# Patient Record
Sex: Male | Born: 1980 | Race: Black or African American | Hispanic: No | Marital: Single | Smoking: Former smoker
Health system: Southern US, Community
[De-identification: ages and names within clinical notes are randomized; demographics above are authoritative.]

## PROBLEM LIST (undated history)

## (undated) DIAGNOSIS — J45909 Unspecified asthma, uncomplicated: Secondary | ICD-10-CM

## (undated) HISTORY — DX: Unspecified asthma, uncomplicated: J45.909

---
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
## 2004-02-05 ENCOUNTER — Emergency Department (HOSPITAL_COMMUNITY): Admission: EM | Admit: 2004-02-05 | Discharge: 2004-02-05 | Payer: Self-pay | Admitting: Emergency Medicine

## 2006-06-10 ENCOUNTER — Encounter: Admission: RE | Admit: 2006-06-10 | Discharge: 2006-06-10 | Payer: Self-pay | Admitting: Occupational Medicine

## 2007-10-09 ENCOUNTER — Emergency Department (HOSPITAL_COMMUNITY): Admission: EM | Admit: 2007-10-09 | Discharge: 2007-10-09 | Payer: Self-pay | Admitting: Family Medicine

## 2009-01-24 ENCOUNTER — Emergency Department (HOSPITAL_COMMUNITY): Admission: EM | Admit: 2009-01-24 | Discharge: 2009-01-24 | Payer: Self-pay | Admitting: Emergency Medicine

## 2009-09-19 ENCOUNTER — Emergency Department (HOSPITAL_COMMUNITY): Admission: EM | Admit: 2009-09-19 | Discharge: 2009-09-19 | Payer: Self-pay | Admitting: Emergency Medicine

## 2010-06-29 LAB — URINALYSIS, ROUTINE W REFLEX MICROSCOPIC
Bilirubin Urine: NEGATIVE
Glucose, UA: NEGATIVE mg/dL
Hgb urine dipstick: NEGATIVE
Ketones, ur: NEGATIVE mg/dL
Nitrite: NEGATIVE
Protein, ur: NEGATIVE mg/dL
Specific Gravity, Urine: 1.005 (ref 1.005–1.030)
Urobilinogen, UA: 0.2 mg/dL (ref 0.0–1.0)
pH: 7.5 (ref 5.0–8.0)

## 2010-06-29 LAB — CBC
MCHC: 33 g/dL (ref 30.0–36.0)
MCV: 88.5 fL (ref 78.0–100.0)
Platelets: 135 10*3/uL — ABNORMAL LOW (ref 150–400)

## 2010-06-29 LAB — DIFFERENTIAL
Basophils Absolute: 0 10*3/uL (ref 0.0–0.1)
Basophils Relative: 0 % (ref 0–1)
Eosinophils Absolute: 0.1 10*3/uL (ref 0.0–0.7)
Neutro Abs: 2.7 10*3/uL (ref 1.7–7.7)
Neutrophils Relative %: 51 % (ref 43–77)

## 2010-06-29 LAB — CK TOTAL AND CKMB (NOT AT ARMC)
CK, MB: 3.2 ng/mL (ref 0.3–4.0)
Relative Index: 0.5 (ref 0.0–2.5)
Total CK: 621 U/L — ABNORMAL HIGH (ref 7–232)

## 2010-06-29 LAB — POCT I-STAT, CHEM 8
Chloride: 104 mEq/L (ref 96–112)
Glucose, Bld: 93 mg/dL (ref 70–99)
HCT: 44 % (ref 39.0–52.0)
Hemoglobin: 15 g/dL (ref 13.0–17.0)
Potassium: 3.5 mEq/L (ref 3.5–5.1)
Sodium: 140 mEq/L (ref 135–145)

## 2010-06-29 LAB — RAPID URINE DRUG SCREEN, HOSP PERFORMED
Amphetamines: NOT DETECTED
Barbiturates: NOT DETECTED
Benzodiazepines: NOT DETECTED
Cocaine: NOT DETECTED
Opiates: NOT DETECTED
Tetrahydrocannabinol: NOT DETECTED

## 2010-06-29 LAB — TROPONIN I: Troponin I: <0.01 ng/mL (ref 0.00–0.06)

## 2013-08-07 ENCOUNTER — Ambulatory Visit: Payer: PRIVATE HEALTH INSURANCE | Admitting: Physician Assistant

## 2013-08-07 VITALS — BP 120/72 | HR 65 | Temp 98.6°F | Resp 16 | Ht 68.0 in | Wt 152.6 lb

## 2013-08-07 DIAGNOSIS — J309 Allergic rhinitis, unspecified: Secondary | ICD-10-CM

## 2013-08-07 DIAGNOSIS — J45901 Unspecified asthma with (acute) exacerbation: Secondary | ICD-10-CM

## 2013-08-07 DIAGNOSIS — R062 Wheezing: Secondary | ICD-10-CM

## 2013-08-07 MED ORDER — MONTELUKAST SODIUM 10 MG PO TABS: 10.0000 mg | ORAL_TABLET | Freq: Every day | ORAL | Status: DC

## 2013-08-07 MED ORDER — ALBUTEROL SULFATE (2.5 MG/3ML) 0.083% IN NEBU: 2.5000 mg | INHALATION_SOLUTION | Freq: Once | RESPIRATORY_TRACT | Status: DC

## 2013-08-07 MED ORDER — PREDNISONE 20 MG PO TABS: ORAL_TABLET | ORAL | Status: DC

## 2013-08-07 MED ORDER — ALBUTEROL SULFATE HFA 108 (90 BASE) MCG/ACT IN AERS: 2.0000 | INHALATION_SPRAY | RESPIRATORY_TRACT | Status: DC | PRN

## 2013-08-07 MED ORDER — IPRATROPIUM BROMIDE 0.02 % IN SOLN: 0.5000 mg | Freq: Once | RESPIRATORY_TRACT | Status: DC

## 2013-08-07 NOTE — Progress Notes (Signed)
   Subjective:    Patient ID: Brian Kline, male    DOB: Aug 21, 1980, 33 y.o.   MRN: [id number]  HPI 33 year old male presents for evaluation 2 day history of SOB, chest tightness, wheezing, and cough.  Symptoms started suddenly as a result of seasonal allergies.  Does normally have problems in the spring/fall and takes Zyrtec which helps somewhat. Uses albuterol prn, usually 1-2 times daily.  Has tried nasal sprays in the past which have not provided any relief.  Has now run out of his inhaler so presents today for refills. Also complains of chest pain while coughing and taking deep breaths. Pain is exacerbated by movement - he does do a lot of heavy lifting at work.   Denies fever, chills, nausea, vomiting, headache, dizziness, otalgia, sore throat, or sinus pain.  He is otherwise doing well with no other concerns today.  Current every day smoker, about 1 ppd. Has failed many attempts to quit including Chantix.  Continues to work on smoking cessation.     Review of Systems  Constitutional: Negative for fever and chills.  HENT: Positive for rhinorrhea. Negative for congestion, postnasal drip, sinus pressure and sore throat.   Respiratory: Positive for cough, chest tightness, shortness of breath and wheezing.   Cardiovascular: Positive for chest pain.  Gastrointestinal: Negative for nausea and vomiting.  Neurological: Negative for dizziness and headaches.       Objective:   Physical Exam  Constitutional: He is oriented to person, place, and time. He appears well-developed and well-nourished.  HENT:  Head: Normocephalic and atraumatic.  Right Ear: External ear normal.  Left Ear: External ear normal.  Eyes: Conjunctivae are normal.  Neck: Normal range of motion. Neck supple.  Cardiovascular: Normal rate, regular rhythm and normal heart sounds.   Pulmonary/Chest: Effort normal. He has wheezes.  Neurological: He is alert and oriented to person, place, and time.  Psychiatric: He has a  normal mood and affect. His behavior is normal. Judgment and thought content normal.      Wheezing and SOB improved s/p albuterol/atrovent nebulizer.     Assessment & Plan:  Asthma with acute exacerbation - Plan: albuterol (PROVENTIL) (2.5 MG/3ML) 0.083% nebulizer solution 2.5 mg, ipratropium (ATROVENT) nebulizer solution 0.5 mg, predniSONE (DELTASONE) 20 MG tablet, albuterol (PROVENTIL HFA;VENTOLIN HFA) 108 (90 BASE) MCG/ACT inhaler,   Wheezing - Plan: albuterol (PROVENTIL) (2.5 MG/3ML) 0.083% nebulizer solution 2.5 mg, ipratropium (ATROVENT) nebulizer solution 0.5 mg, predniSONE (DELTASONE) 20 MG tablet, albuterol (PROVENTIL HFA;VENTOLIN HFA) 108 (90 BASE) MCG/ACT inhaler,   Allergic rhinitis - Plan: montelukast (SINGULAIR) 10 MG tablet,   Will treat acute asthma exacerbation with albuterol inhaler q4-6hours prn wheezing Start prednisone taper today as directed Continue zyrtec daily. Add singulair daily in conjunction with zyrtec RTC precautions discussed. Strongly encouraged CXR secondary to chest pain but patient declined. Will return if symptoms to not improve.

## 2014-07-15 ENCOUNTER — Emergency Department (HOSPITAL_COMMUNITY): Payer: Self-pay

## 2014-07-15 ENCOUNTER — Emergency Department (HOSPITAL_COMMUNITY)
Admission: EM | Admit: 2014-07-15 | Discharge: 2014-07-15 | Disposition: A | Discharge status: Home / Self Care | Payer: Self-pay | Attending: Emergency Medicine | Admitting: Emergency Medicine

## 2014-07-15 ENCOUNTER — Encounter (HOSPITAL_COMMUNITY): Payer: Self-pay | Admitting: Emergency Medicine

## 2014-07-15 DIAGNOSIS — S60511A Abrasion of right hand, initial encounter: Secondary | ICD-10-CM | POA: Insufficient documentation

## 2014-07-15 DIAGNOSIS — S199XXA Unspecified injury of neck, initial encounter: Secondary | ICD-10-CM | POA: Insufficient documentation

## 2014-07-15 DIAGNOSIS — S3992XA Unspecified injury of lower back, initial encounter: Secondary | ICD-10-CM | POA: Insufficient documentation

## 2014-07-15 DIAGNOSIS — T07XXXA Unspecified multiple injuries, initial encounter: Secondary | ICD-10-CM

## 2014-07-15 DIAGNOSIS — S0081XA Abrasion of other part of head, initial encounter: Secondary | ICD-10-CM | POA: Insufficient documentation

## 2014-07-15 DIAGNOSIS — Y9389 Activity, other specified: Secondary | ICD-10-CM | POA: Insufficient documentation

## 2014-07-15 DIAGNOSIS — Z720 Tobacco use: Secondary | ICD-10-CM | POA: Insufficient documentation

## 2014-07-15 DIAGNOSIS — Z79899 Other long term (current) drug therapy: Secondary | ICD-10-CM | POA: Insufficient documentation

## 2014-07-15 DIAGNOSIS — Y92410 Unspecified street and highway as the place of occurrence of the external cause: Secondary | ICD-10-CM | POA: Insufficient documentation

## 2014-07-15 DIAGNOSIS — S8002XA Contusion of left knee, initial encounter: Secondary | ICD-10-CM | POA: Insufficient documentation

## 2014-07-15 DIAGNOSIS — Y998 Other external cause status: Secondary | ICD-10-CM | POA: Insufficient documentation

## 2014-07-15 DIAGNOSIS — J45909 Unspecified asthma, uncomplicated: Secondary | ICD-10-CM | POA: Insufficient documentation

## 2014-07-15 LAB — COMPREHENSIVE METABOLIC PANEL
ALT: 30 U/L (ref 0–53)
ANION GAP: 7 (ref 5–15)
AST: 36 U/L (ref 0–37)
Albumin: 4.1 g/dL (ref 3.5–5.2)
Alkaline Phosphatase: 53 U/L (ref 39–117)
BUN: 21 mg/dL (ref 6–23)
CALCIUM: 9 mg/dL (ref 8.4–10.5)
CO2: 25 mmol/L (ref 19–32)
CREATININE: 0.99 mg/dL (ref 0.50–1.35)
Chloride: 108 mmol/L (ref 96–112)
GFR calc non Af Amer: >90 mL/min (ref >90)
GLUCOSE: 114 mg/dL — AB (ref 70–99)
Potassium: 4 mmol/L (ref 3.5–5.1)
SODIUM: 140 mmol/L (ref 135–145)
TOTAL PROTEIN: 6.6 g/dL (ref 6.0–8.3)
Total Bilirubin: 0.5 mg/dL (ref 0.3–1.2)

## 2014-07-15 LAB — CBC WITH DIFFERENTIAL/PLATELET
Basophils Absolute: 0 10*3/uL (ref 0.0–0.1)
Basophils Relative: 0 % (ref 0–1)
EOS ABS: 0.2 10*3/uL (ref 0.0–0.7)
EOS PCT: 4 % (ref 0–5)
HCT: 40.8 % (ref 39.0–52.0)
Hemoglobin: 13.4 g/dL (ref 13.0–17.0)
LYMPHS ABS: 1.6 10*3/uL (ref 0.7–4.0)
LYMPHS PCT: 30 % (ref 12–46)
MCH: 28.7 pg (ref 26.0–34.0)
MCHC: 32.8 g/dL (ref 30.0–36.0)
MCV: 87.4 fL (ref 78.0–100.0)
MONO ABS: 0.3 10*3/uL (ref 0.1–1.0)
MONOS PCT: 5 % (ref 3–12)
Neutro Abs: 3.2 10*3/uL (ref 1.7–7.7)
Neutrophils Relative %: 61 % (ref 43–77)
PLATELETS: 156 10*3/uL (ref 150–400)
RBC: 4.67 MIL/uL (ref 4.22–5.81)
RDW: 14.8 % (ref 11.5–15.5)
WBC: 5.3 10*3/uL (ref 4.0–10.5)

## 2014-07-15 LAB — ETHANOL

## 2014-07-15 MED ORDER — OXYCODONE-ACETAMINOPHEN 5-325 MG PO TABS: 1.0000 | ORAL_TABLET | Freq: Four times a day (QID) | ORAL | Status: DC | PRN

## 2014-07-15 MED ORDER — ONDANSETRON HCL 4 MG/2ML IJ SOLN
4.0000 mg | Freq: Once | INTRAMUSCULAR | Status: AC
  Administered 2014-07-15: 4 mg via INTRAVENOUS
  Filled 2014-07-15: qty 2

## 2014-07-15 MED ORDER — FENTANYL CITRATE 0.05 MG/ML IJ SOLN
50.0000 ug | Freq: Once | INTRAMUSCULAR | Status: AC
  Administered 2014-07-15: 50 ug via INTRAVENOUS
  Filled 2014-07-15: qty 2

## 2014-07-15 MED ORDER — ALBUTEROL SULFATE HFA 108 (90 BASE) MCG/ACT IN AERS
2.0000 | INHALATION_SPRAY | RESPIRATORY_TRACT | Status: DC | PRN
  Administered 2014-07-15: 2 via RESPIRATORY_TRACT
  Filled 2014-07-15: qty 6.7

## 2014-07-15 MED ORDER — MORPHINE SULFATE 4 MG/ML IJ SOLN
4.0000 mg | Freq: Once | INTRAMUSCULAR | Status: AC
  Administered 2014-07-15: 4 mg via INTRAVENOUS
  Filled 2014-07-15: qty 1

## 2014-07-15 NOTE — ED Notes (Signed)
Cleaned wounds on knee, and hands. Applied petroleum gauze and provided teaching on wound care, provided supplied.
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
## 2014-07-15 NOTE — ED Notes (Signed)
Patient instructed not to drive self home, he acknowledges. Friend at the bedside.

## 2014-07-15 NOTE — ED Notes (Signed)
Patient ambulated in the hallway, approximately 13550feet, moderate limp. Ace wrap teaching along with ice reviewed. Preparing for discharge. Ride is at the bedside.

## 2014-07-15 NOTE — Discharge Instructions (Signed)
Contusion A contusion is a deep bruise. Contusions are the result of an injury that caused bleeding under the skin. The contusion may turn blue, purple, or yellow. Minor injuries will give you a painless contusion, but more severe contusions may stay painful and swollen for a few weeks.  CAUSES  A contusion is usually caused by a blow, trauma, or direct force to an area of the body. SYMPTOMS   Swelling and redness of the injured area.  Bruising of the injured area.  Tenderness and soreness of the injured area.  Pain. DIAGNOSIS  The diagnosis can be made by taking a history and physical exam. An X-ray, CT scan, or MRI may be needed to determine if there were any associated injuries, such as fractures. TREATMENT  Specific treatment will depend on what area of the body was injured. In general, the best treatment for a contusion is resting, icing, elevating, and applying cold compresses to the injured area. Over-the-counter medicines may also be recommended for pain control. Ask your caregiver what the best treatment is for your contusion. HOME CARE INSTRUCTIONS   Put ice on the injured area.  Put ice in a plastic bag.  Place a towel between your skin and the bag.  Leave the ice on for 15-20 minutes, 3-4 times a day, or as directed by your health care provider.  Only take over-the-counter or prescription medicines for pain, discomfort, or fever as directed by your caregiver. Your caregiver may recommend avoiding anti-inflammatory medicines (aspirin, ibuprofen, and naproxen) for 48 hours because these medicines may increase bruising.  Rest the injured area.  If possible, elevate the injured area to reduce swelling. SEEK IMMEDIATE MEDICAL CARE IF:   You have increased bruising or swelling.  You have pain that is getting worse.  Your swelling or pain is not relieved with medicines. MAKE SURE YOU:   Understand these instructions.  Will watch your condition.  Will get help right  away if you are not doing well or get worse. Document Released: 01/06/2005 Document Revised: 04/03/2013 Document Reviewed: 02/01/2011 [facility] Patient Information 2015 [place], [place]. This information is not intended to replace advice given to you by your health care provider. Make sure you discuss any questions you have with your health care provider.  Abrasions An abrasion is a cut or scrape of the skin. Abrasions do not go through all layers of the skin. HOME CARE  If a bandage (dressing) was put on your wound, change it as told by your doctor. If the bandage sticks, soak it off with warm.  Wash the area with water and soap 2 times a day. Rinse off the soap. Pat the area dry with a clean towel.  Put on medicated cream (ointment) as told by your doctor.  Change your bandage right away if it gets wet or dirty.  Only take medicine as told by your doctor.  See your doctor within 24-48 hours to get your wound checked.  Check your wound for redness, puffiness (swelling), or yellowish-white fluid (pus). GET HELP RIGHT AWAY IF:   You have more pain in the wound.  You have redness, swelling, or tenderness around the wound.  You have pus coming from the wound.  You have a fever or lasting symptoms for more than 2-3 days.  You have a fever and your symptoms suddenly get worse.  You have a bad smell coming from the wound or bandage. MAKE SURE YOU:   Understand these instructions.  Will watch your condition.  Will get help right away if you are not doing well or get worse. Document Released: 09/15/2007 Document Revised: 12/22/2011 Document Reviewed: 03/02/2011 [facility] Patient Information 2015 [place], [place]. This information is not intended to replace advice given to you by your health care provider. Make sure you discuss any questions you have with your health care provider. Motor Vehicle Collision It is common to have multiple bruises and sore muscles after a motor vehicle  collision (MVC). These tend to feel worse for the first 24 hours. You may have the most stiffness and soreness over the first several hours. You may also feel worse when you wake up the first morning after your collision. After this point, you will usually begin to improve with each day. The speed of improvement often depends on the severity of the collision, the number of injuries, and the location and nature of these injuries. HOME CARE INSTRUCTIONS  Put ice on the injured area.  Put ice in a plastic bag.  Place a towel between your skin and the bag.  Leave the ice on for 15-20 minutes, 3-4 times a day, or as directed by your health care provider.  Drink enough fluids to keep your urine clear or pale yellow. Do not drink alcohol.  Take a warm shower or bath once or twice a day. This will increase blood flow to sore muscles.  You may return to activities as directed by your caregiver. Be careful when lifting, as this may aggravate neck or back pain.  Only take over-the-counter or prescription medicines for pain, discomfort, or fever as directed by your caregiver. Do not use aspirin. This may increase bruising and bleeding. SEEK IMMEDIATE MEDICAL CARE IF:  You have numbness, tingling, or weakness in the arms or legs.  You develop severe headaches not relieved with medicine.  You have severe neck pain, especially tenderness in the middle of the back of your neck.  You have changes in bowel or bladder control.  There is increasing pain in any area of the body.  You have shortness of breath, light-headedness, dizziness, or fainting.  You have chest pain.  You feel sick to your stomach (nauseous), throw up (vomit), or sweat.  You have increasing abdominal discomfort.  There is blood in your urine, stool, or vomit.  You have pain in your shoulder (shoulder strap areas).  You feel your symptoms are getting worse. MAKE SURE YOU:  Understand these instructions.  Will watch your  condition.  Will get help right away if you are not doing well or get worse. Document Released: 03/29/2005 Document Revised: 08/13/2013 Document Reviewed: 08/26/2010 [facility] Patient Information 2015 [place], [place]. This information is not intended to replace advice given to you by your health care provider. Make sure you discuss any questions you have with your health care provider.

## 2014-09-12 ENCOUNTER — Ambulatory Visit (INDEPENDENT_AMBULATORY_CARE_PROVIDER_SITE_OTHER): Payer: PRIVATE HEALTH INSURANCE | Admitting: Emergency Medicine

## 2014-09-12 VITALS — BP 126/90 | HR 86 | Temp 98.7°F | Resp 16 | Ht 69.0 in | Wt 157.0 lb

## 2014-09-12 DIAGNOSIS — M79641 Pain in right hand: Secondary | ICD-10-CM | POA: Diagnosis not present

## 2014-09-12 DIAGNOSIS — R062 Wheezing: Secondary | ICD-10-CM

## 2014-09-12 DIAGNOSIS — J4531 Mild persistent asthma with (acute) exacerbation: Secondary | ICD-10-CM

## 2014-09-12 DIAGNOSIS — M25562 Pain in left knee: Secondary | ICD-10-CM

## 2014-09-12 MED ORDER — ALBUTEROL SULFATE HFA 108 (90 BASE) MCG/ACT IN AERS: 2.0000 | INHALATION_SPRAY | RESPIRATORY_TRACT | Status: DC | PRN

## 2014-09-12 MED ORDER — FLUTICASONE-SALMETEROL 100-50 MCG/DOSE IN AEPB: 1.0000 | INHALATION_SPRAY | Freq: Two times a day (BID) | RESPIRATORY_TRACT | Status: DC
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
## 2014-09-12 NOTE — Patient Instructions (Signed)

## 2014-09-21 ENCOUNTER — Ambulatory Visit
Admission: RE | Admit: 2014-09-21 | Discharge: 2014-09-21 | Disposition: A | Discharge status: Home / Self Care | Payer: PRIVATE HEALTH INSURANCE | Source: Ambulatory Visit | Attending: Emergency Medicine | Admitting: Emergency Medicine

## 2014-09-21 DIAGNOSIS — M25562 Pain in left knee: Secondary | ICD-10-CM

## 2014-09-21 DIAGNOSIS — J4531 Mild persistent asthma with (acute) exacerbation: Secondary | ICD-10-CM

## 2014-09-21 DIAGNOSIS — M79641 Pain in right hand: Secondary | ICD-10-CM

## 2014-09-23 ENCOUNTER — Other Ambulatory Visit: Payer: Self-pay | Admitting: Emergency Medicine

## 2014-09-23 DIAGNOSIS — M25569 Pain in unspecified knee: Secondary | ICD-10-CM

## 2015-02-21 ENCOUNTER — Telehealth: Payer: Self-pay

## 2015-02-21 NOTE — Telephone Encounter (Addendum)
[facility] called to check the status of records a bills. A release was done 02/19/2015. Was an invoice sent? Fax number: [phone]. Reference # [id number]

## 2015-03-12 DIAGNOSIS — Z0271 Encounter for disability determination: Secondary | ICD-10-CM

## 2015-10-09 ENCOUNTER — Telehealth: Payer: Self-pay

## 2015-10-09 NOTE — Telephone Encounter (Signed)
request for Proair HFA approved x 1 - Noted: pt needs to return for OV Rx 35 year old.

## 2016-02-22 ENCOUNTER — Encounter (HOSPITAL_COMMUNITY): Payer: Self-pay | Admitting: *Deleted

## 2016-02-22 ENCOUNTER — Emergency Department (HOSPITAL_COMMUNITY)
Admission: EM | Admit: 2016-02-22 | Discharge: 2016-02-22 | Disposition: A | Discharge status: Home / Self Care | Payer: PRIVATE HEALTH INSURANCE | Attending: Emergency Medicine | Admitting: Emergency Medicine

## 2016-02-22 DIAGNOSIS — F17210 Nicotine dependence, cigarettes, uncomplicated: Secondary | ICD-10-CM | POA: Diagnosis not present

## 2016-02-22 DIAGNOSIS — R062 Wheezing: Secondary | ICD-10-CM | POA: Diagnosis present

## 2016-02-22 DIAGNOSIS — J45901 Unspecified asthma with (acute) exacerbation: Secondary | ICD-10-CM | POA: Insufficient documentation

## 2016-02-22 MED ORDER — ALBUTEROL SULFATE (2.5 MG/3ML) 0.083% IN NEBU
5.0000 mg | INHALATION_SOLUTION | Freq: Once | RESPIRATORY_TRACT | Status: AC
  Administered 2016-02-22: 5 mg via RESPIRATORY_TRACT
  Filled 2016-02-22: qty 6

## 2016-02-22 MED ORDER — ALBUTEROL SULFATE HFA 108 (90 BASE) MCG/ACT IN AERS
2.0000 | INHALATION_SPRAY | RESPIRATORY_TRACT | Status: DC | PRN
  Administered 2016-02-22: 2 via RESPIRATORY_TRACT
  Filled 2016-02-22: qty 6.7

## 2016-02-22 MED ORDER — PREDNISONE 20 MG PO TABS: 40.0000 mg | ORAL_TABLET | Freq: Every day | ORAL | 0 refills | Status: DC

## 2016-02-22 MED ORDER — IPRATROPIUM BROMIDE 0.02 % IN SOLN
0.5000 mg | Freq: Once | RESPIRATORY_TRACT | Status: AC
  Administered 2016-02-22: 0.5 mg via RESPIRATORY_TRACT
  Filled 2016-02-22: qty 2.5

## 2016-02-22 MED ORDER — AEROCHAMBER PLUS W/MASK MISC
1.0000 | Freq: Once | Status: AC
  Administered 2016-02-22: 1
  Filled 2016-02-22: qty 1

## 2016-02-22 MED ORDER — PREDNISONE 20 MG PO TABS
60.0000 mg | ORAL_TABLET | Freq: Once | ORAL | Status: AC
  Administered 2016-02-22: 60 mg via ORAL
  Filled 2016-02-22: qty 3

## 2016-02-22 NOTE — ED Triage Notes (Signed)
The pt ran out of his inhaler for his asthma 2-3 days ago  He is coughing more  Productive cough yellow  No audible wheezes at present  No resp difficulty. C/o chest congestion and some tightness when he breathes

## 2016-02-22 NOTE — Discharge Instructions (Signed)
1. Medications: albuterol, prednisone, usual home medications °2. Treatment: rest, drink plenty of fluids, begin OTC antihistamine (Zyrtec or Claritin)  °3. Follow Up: Please followup with your primary doctor in 2-3 days for discussion of your diagnoses and further evaluation after today's visit; if you do not have a primary care doctor use the resource guide provided to find one; Please return to the ER for difficulty breathing, high fevers or worsening symptoms. ° °

## 2016-02-22 NOTE — ED Provider Notes (Signed)
[facility]-EMERGENCY DEPT Provider Note   CSN: [id number] Arrival date & time: 02/22/16  0258     History   Chief Complaint Chief Complaint  Patient presents with  . Asthma    HPI Brian Kline is a 35 y.o. male with a hx of asthma presents to the Emergency Department complaining of gradual, persistent, progressively worsening chest tightness, wheezing and cough onset 2 days ago.  Pt reports he ran out of his albuterol MDI 2-3 days ago and has had worsening symptoms since that time.  He reports previous hospitalization for asthma in July 2017, but denies previous intubations.  Pt denies fever, chills, headache, neck pain, chest pain, abd pain, N/V/D.  Denies sick contacts.  Nothing seems to make his symptoms better.       The history is provided by the patient and medical records. No language interpreter was used.  Asthma  Associated symptoms include shortness of breath.    Past Medical History:  Diagnosis Date  . Asthma     There are no active problems to display for this patient.   History reviewed. No pertinent surgical history.     Home Medications    Prior to Admission medications   Medication Sig Start Date End Date Taking? Authorizing Provider  albuterol (PROVENTIL HFA;VENTOLIN HFA) 108 (90 BASE) MCG/ACT inhaler Inhale 2 puffs into the lungs every 4 (four) hours as needed for wheezing or shortness of breath (cough, shortness of breath or wheezing.). 09/12/14   Carmelina DaneJeffery S Anderson, MD  Fluticasone-Salmeterol (ADVAIR) 100-50 MCG/DOSE AEPB Inhale 1 puff into the lungs 2 (two) times daily. 09/12/14   Carmelina DaneJeffery S Anderson, MD  predniSONE (DELTASONE) 20 MG tablet Take 2 tablets (40 mg total) by mouth daily. 02/22/16   Dahlia ClientHannah Donika Butner, PA-C    Family History Family History  Problem Relation Age of Onset  . Diabetes Mother     Social History Social History  Substance Use Topics  . Smoking status: Current Every Day Smoker    Packs/day: 1.00    Types: Cigarettes  .  Smokeless tobacco: Never Used  . Alcohol use Yes     Comment: consumes twice a month     Allergies   Other and Pollen extract   Review of Systems Review of Systems  Respiratory: Positive for cough, shortness of breath and wheezing.   All other systems reviewed and are negative.    Physical Exam Updated Vital Signs BP 138/88   Pulse 69   Temp 98.2 F (36.8 C) (Oral)   Resp 20   Ht 5\' 10"  (1.778 m)   Wt 70.3 kg   SpO2 100%   BMI 22.24 kg/m   Physical Exam  Constitutional: He appears well-developed and well-nourished. No distress.  Awake, alert, nontoxic appearance  HENT:  Head: Normocephalic and atraumatic.  Mouth/Throat: Oropharynx is clear and moist. No oropharyngeal exudate.  Eyes: Conjunctivae are normal. No scleral icterus.  Neck: Normal range of motion. Neck supple.  Cardiovascular: Normal rate, regular rhythm and intact distal pulses.   Pulmonary/Chest: Effort normal. No respiratory distress. He has decreased breath sounds (throughout). He has no wheezes.  Equal chest expansion Diminished throughout  Abdominal: Soft. Bowel sounds are normal. He exhibits no mass. There is no tenderness. There is no rebound and no guarding.  Musculoskeletal: Normal range of motion. He exhibits no edema.  Neurological: He is alert.  Speech is clear and goal oriented Moves extremities without ataxia  Skin: Skin is warm and dry. He is not diaphoretic.  Psychiatric: He has a normal mood and affect.  Nursing note and vitals reviewed.    ED Treatments / Results   Procedures Procedures (including critical care time)  Medications Ordered in ED Medications  albuterol (PROVENTIL HFA;VENTOLIN HFA) 108 (90 Base) MCG/ACT inhaler 2 puff (not administered)  aerochamber plus with mask device 1 each (not administered)  albuterol (PROVENTIL) (2.5 MG/3ML) 0.083% nebulizer solution 5 mg (5 mg Nebulization Given 02/22/16 0325)  ipratropium (ATROVENT) nebulizer solution 0.5 mg (0.5 mg  Nebulization Given 02/22/16 0325)  predniSONE (DELTASONE) tablet 60 mg (60 mg Oral Given 02/22/16 0325)     Initial Impression / Assessment and Plan / ED Course  I have reviewed the triage vital signs and the nursing notes.  Pertinent labs & imaging results that were available during my care of the patient were reviewed by me and considered in my medical decision making (see chart for details).  Clinical Course    Pt with asthma exacerbation.  Patient ambulated in ED with O2 saturations maintained >90, no current signs of respiratory distress. Lung exam improved after nebulizer treatment. Prednisone given in the ED and pt will be dc with 5 day burst. Pt states they are breathing at baseline. Pt has been instructed to continue using prescribed medications and to speak with PCP about today's exacerbation.    Final Clinical Impressions(s) / ED Diagnoses   Final diagnoses:  Exacerbation of asthma, unspecified asthma severity, unspecified whether persistent    New Prescriptions New Prescriptions   PREDNISONE (DELTASONE) 20 MG TABLET    Take 2 tablets (40 mg total) by mouth daily.     Dahlia ClientHannah Khadijatou Borak, PA-C 02/22/16 0400    Dione Boozeavid Glick, MD 02/22/16 [phone]
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
## 2017-01-17 ENCOUNTER — Encounter (HOSPITAL_COMMUNITY): Payer: Self-pay | Admitting: Emergency Medicine

## 2017-01-17 DIAGNOSIS — M25562 Pain in left knee: Secondary | ICD-10-CM | POA: Insufficient documentation

## 2017-01-17 DIAGNOSIS — R062 Wheezing: Secondary | ICD-10-CM | POA: Diagnosis not present

## 2017-01-17 DIAGNOSIS — Y9389 Activity, other specified: Secondary | ICD-10-CM | POA: Insufficient documentation

## 2017-01-17 DIAGNOSIS — J45909 Unspecified asthma, uncomplicated: Secondary | ICD-10-CM | POA: Diagnosis not present

## 2017-01-17 DIAGNOSIS — M549 Dorsalgia, unspecified: Secondary | ICD-10-CM | POA: Insufficient documentation

## 2017-01-17 DIAGNOSIS — Y92411 Interstate highway as the place of occurrence of the external cause: Secondary | ICD-10-CM | POA: Insufficient documentation

## 2017-01-17 DIAGNOSIS — M542 Cervicalgia: Secondary | ICD-10-CM | POA: Diagnosis not present

## 2017-01-17 DIAGNOSIS — R51 Headache: Secondary | ICD-10-CM | POA: Diagnosis not present

## 2017-01-17 DIAGNOSIS — M25521 Pain in right elbow: Secondary | ICD-10-CM | POA: Diagnosis not present

## 2017-01-17 DIAGNOSIS — Y999 Unspecified external cause status: Secondary | ICD-10-CM | POA: Insufficient documentation

## 2017-01-17 DIAGNOSIS — M25511 Pain in right shoulder: Secondary | ICD-10-CM | POA: Diagnosis not present

## 2017-01-17 DIAGNOSIS — F17210 Nicotine dependence, cigarettes, uncomplicated: Secondary | ICD-10-CM | POA: Insufficient documentation
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
## 2017-01-18 ENCOUNTER — Emergency Department (HOSPITAL_COMMUNITY)
Admission: EM | Admit: 2017-01-18 | Discharge: 2017-01-18 | Disposition: A | Discharge status: Home / Self Care | Payer: No Typology Code available for payment source | Attending: Emergency Medicine | Admitting: Emergency Medicine

## 2017-01-18 ENCOUNTER — Encounter (HOSPITAL_COMMUNITY): Payer: Self-pay | Admitting: Radiology

## 2017-01-18 ENCOUNTER — Emergency Department (HOSPITAL_COMMUNITY): Payer: No Typology Code available for payment source

## 2017-01-18 MED ORDER — ALBUTEROL SULFATE HFA 108 (90 BASE) MCG/ACT IN AERS: 1.0000 | INHALATION_SPRAY | Freq: Four times a day (QID) | RESPIRATORY_TRACT | 0 refills | Status: DC | PRN

## 2017-01-18 MED ORDER — NAPROXEN 500 MG PO TABS: 500.0000 mg | ORAL_TABLET | Freq: Two times a day (BID) | ORAL | 0 refills | Status: DC

## 2017-01-18 MED ORDER — HYDROCODONE-ACETAMINOPHEN 5-325 MG PO TABS
1.0000 | ORAL_TABLET | Freq: Once | ORAL | Status: AC
Start: 2017-01-18 — End: 2017-01-18
  Administered 2017-01-18: 1 via ORAL
  Filled 2017-01-18: qty 1

## 2017-01-18 MED ORDER — METHOCARBAMOL 500 MG PO TABS: 500.0000 mg | ORAL_TABLET | Freq: Two times a day (BID) | ORAL | 0 refills | Status: DC

## 2017-01-18 MED ORDER — ALBUTEROL SULFATE (2.5 MG/3ML) 0.083% IN NEBU
2.5000 mg | INHALATION_SOLUTION | RESPIRATORY_TRACT | Status: DC | PRN
  Administered 2017-01-18: 2.5 mg via RESPIRATORY_TRACT
  Filled 2017-01-18: qty 3
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
## 2017-01-18 NOTE — ED Notes (Signed)
Bed: WA21 Expected date:  Expected time:  Means of arrival:  Comments: 

## 2017-01-18 NOTE — ED Notes (Signed)
Pt in Radiology getting scans.

## 2017-01-18 NOTE — Discharge Instructions (Signed)
Ibuprofen for pain.  Robaxin for muscle spasm

## 2020-10-22 ENCOUNTER — Ambulatory Visit
Admission: RE | Admit: 2020-10-22 | Discharge: 2020-10-22 | Disposition: A | Discharge status: Home / Self Care | Payer: 59 | Source: Ambulatory Visit | Attending: Urgent Care | Admitting: Urgent Care

## 2020-10-22 ENCOUNTER — Other Ambulatory Visit: Payer: Self-pay

## 2020-10-22 VITALS — BP 178/101 | HR 90 | Temp 99.4°F | Resp 16

## 2020-10-22 DIAGNOSIS — I10 Essential (primary) hypertension: Secondary | ICD-10-CM

## 2020-10-22 DIAGNOSIS — H6981 Other specified disorders of Eustachian tube, right ear: Secondary | ICD-10-CM | POA: Diagnosis not present

## 2020-10-22 DIAGNOSIS — R519 Headache, unspecified: Secondary | ICD-10-CM

## 2020-10-22 DIAGNOSIS — H6991 Unspecified Eustachian tube disorder, right ear: Secondary | ICD-10-CM

## 2020-10-22 MED ORDER — LEVOCETIRIZINE DIHYDROCHLORIDE 5 MG PO TABS: 5.0000 mg | ORAL_TABLET | Freq: Every evening | ORAL | 0 refills | Status: DC

## 2020-10-22 MED ORDER — AMLODIPINE BESYLATE 10 MG PO TABS: 10.0000 mg | ORAL_TABLET | Freq: Every day | ORAL | 0 refills | Status: DC

## 2020-10-22 MED ORDER — FLUTICASONE PROPIONATE 50 MCG/ACT NA SUSP: 2.0000 | Freq: Every day | NASAL | 0 refills | Status: DC

## 2020-10-22 NOTE — Discharge Instructions (Addendum)
Start taking amlodipine as a blood pressure medication. Start by taking 1/2 tablet once daily for 3 days. Then increase to 1 full tablet daily until you see your new PCP.   If your headache is severe and does not go away with Tylenol then report to the emergency room for a head CT scan.   For diabetes or elevated blood sugar, please make sure you are limiting and avoiding starchy, carbohydrate foods like pasta, breads, sweet breads, pastry, rice, potatoes, desserts. These foods can elevate your blood sugar. Also, limit and avoid drinks that contain a lot of sugar such as sodas, sweet teas, fruit juices.  Drinking plain water will be much more helpful, try 64 ounces of water daily.  It is okay to flavor your water naturally by cutting cucumber, lemon, mint or lime, placing it in a picture with water and drinking it over a period of 24-48 hours as long as it remains refrigerated.  For elevated blood pressure, make sure you are monitoring salt in your diet.  Do not eat restaurant foods and limit processed foods at home. I highly recommend you prepare and cook your own foods at home.  Processed foods include things like frozen meals, pre-seasoned meats and dinners, deli meats, canned foods as these foods contain a high amount of sodium/salt.  Make sure you are paying attention to sodium labels on foods you buy at the grocery store. Buy your spices separately such as garlic powder, onion powder, cumin, cayenne, parsley flakes so that you can avoid seasonings that contain salt. However, salt-free seasonings are available and can be used, an example is Mrs. Dash and includes a lot of different mixtures that do not contain salt.  Lastly, when cooking using oils that are healthier for you is important. This includes olive oil, avocado oil, canola oil. We have discussed a lot of foods to avoid but below is a list of foods that can be very healthy to use in your diet whether it is for diabetes, cholesterol, high blood  pressure, or in general healthy eating.  Salads - kale, spinach, cabbage, spring mix, arugula Fruits - avocadoes, berries (blueberries, raspberries, blackberries), apples, oranges, pomegranate, grapefruit, kiwi Vegetables - asparagus, cauliflower, broccoli, green beans, brussel sprouts, bell peppers, beets; stay away from or limit starchy vegetables like potatoes, carrots, peas Other general foods - kidney beans, egg whites, almonds, walnuts, sunflower seeds, pumpkin seeds, fat free yogurt, almond milk, flax seeds, quinoa, oats  Meat - It is better to eat lean meats and limit your red meat including pork to once a week.  Wild caught fish, chicken breast are good options as they tend to be leaner sources of good protein. Still be mindful of the sodium labels for the meats you buy.  DO NOT EAT ANY FOODS ON THIS LIST THAT YOU ARE ALLERGIC TO. For more specific needs, I highly recommend consulting a dietician or nutritionist but this can definitely be a good starting point.

## 2020-10-22 NOTE — ED Triage Notes (Signed)
Pt presents with c/o right ear pain X 3 months.   States he hears a ringing in his ear and states he has been having trouble thinking. C/o throbbing in his ear and states it goes with the beat of his heart. Pt states he has been experiencing memory loss and brain fog along with the ear pain. States the ear pain has caused moderate headaches.   Pt states he has been taking Prednisone since last week after a Tele Doc visit.

## 2020-10-22 NOTE — ED Provider Notes (Signed)
[facility]   MRN: [id number] DOB: 07-18-1980  Subjective:   Brian Kline is a 40 y.o. male presenting for 32-month history of persistent right-sided ear pain with ringing and dizziness, sensation of fullness.  At times he is also felt throbbing of his ear pain that actually causes headaches.  He feels more fatigued and brain fog as well over the past 3 months.  Feels like it is harder for him to remember things that he normally does not have heart tenderness.  He actually had a visit through telemedicine and was prescribed an oral prednisone course reports that is not helping at all.  In fact feels worse.  Denies any active headache, confusion, vision change, chest pain, shortness of breath, heart racing, abdominal pain, hematuria, numbness or tingling, weakness.  He has never been told that he has high blood pressure or taken medications for this.  He was previously smoking cigarettes and started vaping has an effort to quit.  He does have asthma and uses his albuterol inhaler 1-2 times a day.  He tries to hydrate very well, drinks a lot of energy drinks.  Limits his alcohol use.  Denies history of stroke or MI.  However, his mother does have a history of diabetes and was on dialysis.  No current facility-administered medications for this encounter.  Current Outpatient Medications:    albuterol (PROVENTIL HFA;VENTOLIN HFA) 108 (90 Base) MCG/ACT inhaler, Inhale 1-2 puffs into the lungs every 6 (six) hours as needed for wheezing., Disp: 1 Inhaler, Rfl: 0   DM-Phenylephrine-Acetaminophen (VICKS DAYQUIL COLD & FLU) 10-5-325 MG CAPS, Take 2 capsules by mouth once., Disp: , Rfl:    methocarbamol (ROBAXIN) 500 MG tablet, Take 1 tablet (500 mg total) by mouth 2 (two) times daily., Disp: 20 tablet, Rfl: 0   naproxen (NAPROSYN) 500 MG tablet, Take 1 tablet (500 mg total) by mouth 2 (two) times daily., Disp: 30 tablet, Rfl: 0    Allergies  Allergen Reactions   Other Swelling    Seafood  allergy "i swell up"   Pollen Extract Other (See Comments)    Seasonal allergies     Past Medical History:  Diagnosis Date   Asthma      History reviewed. No pertinent surgical history.  Family History  Problem Relation Age of Onset   Diabetes Mother     Social History   Tobacco Use   Smoking status: Every Day    Packs/day: 1.00    Pack years: 0.00    Types: Cigarettes   Smokeless tobacco: Never  Vaping Use   Vaping Use: Every day  Substance Use Topics   Alcohol use: Yes    Comment: consumes twice a month    ROS   Objective:   Vitals: BP (!) 178/101 (BP Location: Left Arm)   Pulse 90   Temp 99.4 F (37.4 C) (Oral)   Resp 16   SpO2 99%   BP Readings from Last 3 Encounters:  10/22/20 (!) 178/101  01/18/17 (!) 146/100  02/22/16 157/85   Physical Exam Constitutional:      General: He is not in acute distress.    Appearance: Normal appearance. He is well-developed. He is not ill-appearing, toxic-appearing or diaphoretic.  HENT:     Head: Normocephalic and atraumatic.     Right Ear: Tympanic membrane, ear canal and external ear normal. There is no impacted cerumen.     Left Ear: Tympanic membrane, ear canal and external ear normal. There is no impacted  cerumen.     Nose: Nose normal.     Mouth/Throat:     Mouth: Mucous membranes are moist.  Eyes:     General: No scleral icterus.       Right eye: No discharge.        Left eye: No discharge.     Extraocular Movements: Extraocular movements intact.     Conjunctiva/sclera: Conjunctivae normal.     Pupils: Pupils are equal, round, and reactive to light.  Cardiovascular:     Rate and Rhythm: Normal rate and regular rhythm.     Heart sounds: Normal heart sounds. No murmur heard.   No friction rub. No gallop.  Pulmonary:     Effort: Pulmonary effort is normal. No respiratory distress.     Breath sounds: Normal breath sounds. No stridor. No wheezing, rhonchi or rales.  Skin:    General: Skin is warm and  dry.  Neurological:     Mental Status: He is alert and oriented to person, place, and time.     Cranial Nerves: No cranial nerve deficit.     Motor: No weakness.     Coordination: Coordination normal.     Gait: Gait normal.     Deep Tendon Reflexes: Reflexes normal.     Comments: Negative Romberg and Pronator drift.   Psychiatric:        Mood and Affect: Mood normal.        Behavior: Behavior normal.        Thought Content: Thought content normal.        Judgment: Judgment normal.    ED ECG REPORT   Date: 10/22/2020  EKG Time: 10:43 AM  Rate: 74bpm  Rhythm: normal sinus rhythm,  normal EKG, normal sinus rhythm  Axis: Normal  Intervals:none  ST&T Change: Nonspecific T wave flattening in lead III  Narrative Interpretation: Sinus rhythm at 74 bpm with nonspecific T wave changes, signs of LVH and left atrial enlargement.   Assessment and Plan :   PDMP not reviewed this encounter.  1. Dysfunction of right eustachian tube   2. Generalized headaches   3. Essential hypertension     Patient does not have signs of an acute encephalopathy, intracranial process.  Emphasized need to have better blood pressure control.  We will have him start on amlodipine, make significant dietary and lifestyle changes.  Suspect that his right ear pain is eustachian tube dysfunction which I reviewed with patient.  We will have him start Flonase, levocetirizine.  Recommended following up with a new primary care provider, place into the PCP assistance program.  Reviewed strict ER precautions. Counseled patient on potential for adverse effects with medications prescribed today, patient verbalized understanding.    Wallis Bamberg, PA-C 10/22/20 1044

## 2021-03-03 ENCOUNTER — Ambulatory Visit
Admission: EM | Admit: 2021-03-03 | Discharge: 2021-03-03 | Disposition: A | Discharge status: Home / Self Care | Payer: 59 | Attending: Physician Assistant | Admitting: Physician Assistant

## 2021-03-03 DIAGNOSIS — J45909 Unspecified asthma, uncomplicated: Secondary | ICD-10-CM | POA: Diagnosis not present

## 2021-03-03 DIAGNOSIS — I10 Essential (primary) hypertension: Secondary | ICD-10-CM

## 2021-03-03 MED ORDER — AMLODIPINE BESYLATE 10 MG PO TABS: 10.0000 mg | ORAL_TABLET | Freq: Every day | ORAL | 0 refills | Status: DC

## 2021-03-03 MED ORDER — FLUTICASONE PROPIONATE 50 MCG/ACT NA SUSP: 2.0000 | Freq: Every day | NASAL | 1 refills | Status: AC

## 2021-03-03 MED ORDER — ALBUTEROL SULFATE HFA 108 (90 BASE) MCG/ACT IN AERS: 1.0000 | INHALATION_SPRAY | Freq: Four times a day (QID) | RESPIRATORY_TRACT | 1 refills | Status: AC | PRN

## 2021-03-03 NOTE — ED Provider Notes (Signed)
[facility]    CSN: [id number] Arrival date & time: 03/03/21  1336      History   Chief Complaint Chief Complaint  Patient presents with   Medication Refill    HPI Brian Kline is a 40 y.o. male.   Patient here today for medication refill of his amlodipine, albuterol and Flonase.  He states he has not been able to establish care with PCP.  He has not taken his blood pressure medicine in well over a month.  He states that while he was taking amlodipine he did not have any side effects.  He does report that he still had some ringing in his ears but did not follow-up to determine if blood pressure was well controlled with treatment.  The history is provided by the patient.  Medication Refill  Past Medical History:  Diagnosis Date   Asthma     There are no problems to display for this patient.   History reviewed. No pertinent surgical history.     Home Medications    Prior to Admission medications   Medication Sig Start Date End Date Taking? Authorizing Provider  albuterol (VENTOLIN HFA) 108 (90 Base) MCG/ACT inhaler Inhale 1-2 puffs into the lungs every 6 (six) hours as needed for wheezing. 03/03/21 05/02/21  Tomi Bamberger, PA-C  amLODipine (NORVASC) 10 MG tablet Take 1 tablet (10 mg total) by mouth daily. 03/03/21 05/02/21  Tomi Bamberger, PA-C  fluticasone (FLONASE) 50 MCG/ACT nasal spray Place 2 sprays into both nostrils daily. 03/03/21   Tomi Bamberger, PA-C    Family History Family History  Problem Relation Age of Onset   Diabetes Mother     Social History Social History   Tobacco Use   Smoking status: Former   Smokeless tobacco: Never  Building services engineer Use: Every day  Substance Use Topics   Alcohol use: Yes    Comment: consumes twice a month   Drug use: Not Currently     Allergies   Other and Pollen extract   Review of Systems Review of Systems  Constitutional:  Negative for chills and fever.  Eyes:  Negative for  discharge and redness.  Respiratory:  Negative for shortness of breath.   Gastrointestinal:  Negative for nausea and vomiting.  Skin:  Positive for color change and wound.  Neurological:  Negative for numbness.    Physical Exam Triage Vital Signs ED Triage Vitals  Enc Vitals Group     BP 03/03/21 1618 (!) 161/108     Pulse Rate 03/03/21 1618 96     Resp 03/03/21 1618 16     Temp 03/03/21 1618 98.5 F (36.9 C)     Temp Source 03/03/21 1618 Oral     SpO2 03/03/21 1618 98 %     Weight --      Height --      Head Circumference --      Peak Flow --      Pain Score 03/03/21 1624 3     Pain Loc --      Pain Edu? --      Excl. in GC? --    No data found.  Updated Vital Signs BP (!) 161/108 (BP Location: Right Arm)   Pulse 96   Temp 98.5 F (36.9 C) (Oral)   Resp 16   SpO2 98%      Physical Exam Vitals and nursing note reviewed.  Constitutional:      General: He  is not in acute distress.    Appearance: Normal appearance. He is not ill-appearing.  HENT:     Head: Normocephalic and atraumatic.  Eyes:     Conjunctiva/sclera: Conjunctivae normal.  Cardiovascular:     Rate and Rhythm: Normal rate and regular rhythm.     Pulses: Normal pulses.     Heart sounds: No murmur heard. Pulmonary:     Effort: Pulmonary effort is normal. No respiratory distress.     Breath sounds: Normal breath sounds. No wheezing or rhonchi.  Neurological:     Mental Status: He is alert.  Psychiatric:        Mood and Affect: Mood normal.        Behavior: Behavior normal.        Thought Content: Thought content normal.     UC Treatments / Results  Labs (all labs ordered are listed, but only abnormal results are displayed) Labs Reviewed - No data to display  EKG   Radiology No results found.  Procedures Procedures (including critical care time)  Medications Ordered in UC Medications - No data to display  Initial Impression / Assessment and Plan / UC Course  I have reviewed the  triage vital signs and the nursing notes.  Pertinent labs & imaging results that were available during my care of the patient were reviewed by me and considered in my medical decision making (see chart for details).    Medications refilled at prior dosages.  Recommended follow-up with PCP and appointment made today for follow-up with same on 04/27/2021.  Encouraged follow-up in the meantime with any further concerns.  Final Clinical Impressions(s) / UC Diagnoses   Final diagnoses:  Essential hypertension  Uncomplicated asthma, unspecified asthma severity, unspecified whether persistent   Discharge Instructions   None    ED Prescriptions     Medication Sig Dispense Auth. Provider   albuterol (VENTOLIN HFA) 108 (90 Base) MCG/ACT inhaler Inhale 1-2 puffs into the lungs every 6 (six) hours as needed for wheezing. 1 each Tomi Bamberger, PA-C   amLODipine (NORVASC) 10 MG tablet Take 1 tablet (10 mg total) by mouth daily. 60 tablet Erma Pinto F, PA-C   fluticasone [facility]) 50 MCG/ACT nasal spray Place 2 sprays into both nostrils daily. 16 g Tomi Bamberger, PA-C      PDMP not reviewed this encounter.   Tomi Bamberger, PA-C 03/03/21 1706

## 2021-03-03 NOTE — ED Triage Notes (Signed)
Pt requesting refill on his amlodipine, albuterol, and Flonase. States been out for a month.

## 2021-03-13 ENCOUNTER — Encounter: Payer: Self-pay | Admitting: Family Medicine

## 2021-04-27 ENCOUNTER — Ambulatory Visit: Payer: 59 | Admitting: Family Medicine

## 2021-04-30 ENCOUNTER — Ambulatory Visit: Payer: 59 | Admitting: Family Medicine

## 2021-05-12 ENCOUNTER — Other Ambulatory Visit: Payer: Self-pay

## 2021-05-12 ENCOUNTER — Ambulatory Visit
Admission: RE | Admit: 2021-05-12 | Discharge: 2021-05-12 | Disposition: A | Discharge status: Home / Self Care | Payer: 59 | Source: Ambulatory Visit | Attending: Urgent Care | Admitting: Urgent Care

## 2021-05-12 VITALS — BP 152/88 | HR 84 | Temp 99.0°F | Ht 70.0 in | Wt 155.0 lb

## 2021-05-12 DIAGNOSIS — I10 Essential (primary) hypertension: Secondary | ICD-10-CM

## 2021-05-12 DIAGNOSIS — R030 Elevated blood-pressure reading, without diagnosis of hypertension: Secondary | ICD-10-CM

## 2021-05-12 MED ORDER — AMLODIPINE BESYLATE 10 MG PO TABS: 10.0000 mg | ORAL_TABLET | Freq: Every day | ORAL | 0 refills | Status: AC

## 2021-05-12 NOTE — Discharge Instructions (Signed)

## 2021-05-12 NOTE — ED Provider Notes (Signed)
[facility]   MRN: [id number] DOB: 08-21-1980  Subjective:   Brian Kline is a 41 y.o. male presenting for medication refill of his amlodipine.  Patient states that he has been out of this medication and has felt his pressure of more.  He did have a diagnosis of bronchitis and has been using prednisone as prescribed recently.  Denies headache, confusion, weakness, numbness or tingling, chest pain, heart racing, nausea, vomiting, abdominal pain.  Patient does have a PCP now, is scheduled to see them in March.  No current facility-administered medications for this encounter.  Current Outpatient Medications:    fluticasone (FLONASE) 50 MCG/ACT nasal spray, Place 2 sprays into both nostrils daily., Disp: 16 g, Rfl: 1   albuterol (VENTOLIN HFA) 108 (90 Base) MCG/ACT inhaler, Inhale 1-2 puffs into the lungs every 6 (six) hours as needed for wheezing., Disp: 1 each, Rfl: 1   amLODipine (NORVASC) 10 MG tablet, Take 1 tablet (10 mg total) by mouth daily., Disp: 90 tablet, Rfl: 0   Allergies  Allergen Reactions   Other Swelling    Seafood allergy "i swell up"   Pollen Extract Other (See Comments)    Seasonal allergies     Past Medical History:  Diagnosis Date   Asthma      History reviewed. No pertinent surgical history.  Family History  Problem Relation Age of Onset   Diabetes Mother    Healthy Father    Healthy Brother    Healthy Brother     Social History   Tobacco Use   Smoking status: Former   Smokeless tobacco: Never  Building services engineer Use: Every day  Substance Use Topics   Alcohol use: Yes    Comment: consumes twice a month   Drug use: Not Currently    ROS   Objective:   Vitals: BP (!) 152/88 (BP Location: Left Arm)    Pulse 84    Temp 99 F (37.2 C) (Oral)    Ht 5\' 10"  (1.778 m)    Wt 155 lb (70.3 kg)    SpO2 98%    BMI 22.24 kg/m   Physical Exam Constitutional:      General: He is not in acute distress.    Appearance: Normal  appearance. He is well-developed. He is not ill-appearing, toxic-appearing or diaphoretic.  HENT:     Head: Normocephalic and atraumatic.     Right Ear: External ear normal.     Left Ear: External ear normal.     Nose: Nose normal.     Mouth/Throat:     Mouth: Mucous membranes are moist.  Eyes:     General: No scleral icterus.       Right eye: No discharge.        Left eye: No discharge.     Extraocular Movements: Extraocular movements intact.  Cardiovascular:     Rate and Rhythm: Normal rate and regular rhythm.     Heart sounds: Normal heart sounds. No murmur heard.   No friction rub. No gallop.  Pulmonary:     Effort: Pulmonary effort is normal. No respiratory distress.     Breath sounds: Normal breath sounds. No stridor. No wheezing, rhonchi or rales.  Neurological:     Mental Status: He is alert and oriented to person, place, and time.  Psychiatric:        Mood and Affect: Mood normal.        Behavior: Behavior normal.  Thought Content: Thought content normal.        Judgment: Judgment normal.    Assessment and Plan :   PDMP not reviewed this encounter.  1. Essential hypertension   2. Elevated blood pressure reading    Reassuring physical exam findings.  Refilled his amlodipine for 90 days.  Discussed general hypertensive friendly diet. Counseled patient on potential for adverse effects with medications prescribed/recommended today, ER and return-to-clinic precautions discussed, patient verbalized understanding.    Wallis Bamberg, PA-C 05/12/21 1710

## 2021-05-12 NOTE — ED Triage Notes (Signed)
Patient requesting a refill on HBP medication Norvasc.  Patient has been out of medication x 4 days.  Scheduled with a new PCP on 07/02/2021.

## 2021-07-02 ENCOUNTER — Ambulatory Visit: Payer: 59 | Admitting: Family Medicine
